# Patient Record
Sex: Female | Born: 1985 | Race: Black or African American | Hispanic: No | Smoking: Current every day smoker
Health system: Southern US, Community
[De-identification: ages and names within clinical notes are randomized; demographics above are authoritative.]

## PROBLEM LIST (undated history)

## (undated) DIAGNOSIS — G8929 Other chronic pain: Secondary | ICD-10-CM

## (undated) DIAGNOSIS — R51 Headache: Secondary | ICD-10-CM

## (undated) DIAGNOSIS — R519 Headache, unspecified: Secondary | ICD-10-CM

## (undated) HISTORY — PX: TONSILLECTOMY: SUR1361

## (undated) HISTORY — PX: LEG SURGERY: SHX1003

## (undated) HISTORY — PX: ADENOIDECTOMY: SUR15

---

## 2010-12-09 ENCOUNTER — Emergency Department (HOSPITAL_COMMUNITY)
Admission: EM | Admit: 2010-12-09 | Discharge: 2010-12-09 | Disposition: A | Discharge status: Other Acute Inpatient Hospital | Payer: Self-pay | Attending: Emergency Medicine | Admitting: Emergency Medicine

## 2010-12-09 ENCOUNTER — Inpatient Hospital Stay (HOSPITAL_COMMUNITY)
Admission: AD | Admit: 2010-12-09 | Discharge: 2010-12-09 | Disposition: A | Discharge status: Home / Self Care | Payer: Self-pay | Source: Ambulatory Visit | Attending: Obstetrics and Gynecology | Admitting: Obstetrics and Gynecology

## 2010-12-09 DIAGNOSIS — O9989 Other specified diseases and conditions complicating pregnancy, childbirth and the puerperium: Secondary | ICD-10-CM | POA: Insufficient documentation

## 2010-12-09 DIAGNOSIS — O479 False labor, unspecified: Secondary | ICD-10-CM | POA: Insufficient documentation

## 2010-12-09 DIAGNOSIS — R109 Unspecified abdominal pain: Secondary | ICD-10-CM | POA: Insufficient documentation

## 2013-01-27 ENCOUNTER — Emergency Department (HOSPITAL_COMMUNITY): Payer: Self-pay

## 2013-01-27 ENCOUNTER — Encounter (HOSPITAL_COMMUNITY): Payer: Self-pay | Admitting: *Deleted

## 2013-01-27 ENCOUNTER — Emergency Department (HOSPITAL_COMMUNITY)
Admission: EM | Admit: 2013-01-27 | Discharge: 2013-01-27 | Disposition: A | Discharge status: Home / Self Care | Payer: Self-pay | Attending: Emergency Medicine | Admitting: Emergency Medicine

## 2013-01-27 DIAGNOSIS — F17200 Nicotine dependence, unspecified, uncomplicated: Secondary | ICD-10-CM | POA: Insufficient documentation

## 2013-01-27 DIAGNOSIS — R197 Diarrhea, unspecified: Secondary | ICD-10-CM | POA: Insufficient documentation

## 2013-01-27 DIAGNOSIS — R1084 Generalized abdominal pain: Secondary | ICD-10-CM | POA: Insufficient documentation

## 2013-01-27 DIAGNOSIS — R109 Unspecified abdominal pain: Secondary | ICD-10-CM

## 2013-01-27 DIAGNOSIS — Z3202 Encounter for pregnancy test, result negative: Secondary | ICD-10-CM | POA: Insufficient documentation

## 2013-01-27 LAB — CBC WITH DIFFERENTIAL/PLATELET
Basophils Absolute: 0 10*3/uL (ref 0.0–0.1)
Eosinophils Absolute: 0.1 10*3/uL (ref 0.0–0.7)
Eosinophils Relative: 1 % (ref 0–5)
Lymphs Abs: 1.8 10*3/uL (ref 0.7–4.0)
MCH: 32.3 pg (ref 26.0–34.0)
MCHC: 34.2 g/dL (ref 30.0–36.0)
MCV: 94.2 fL (ref 78.0–100.0)
Platelets: 182 10*3/uL (ref 150–400)
RDW: 12.7 % (ref 11.5–15.5)

## 2013-01-27 LAB — URINALYSIS, ROUTINE W REFLEX MICROSCOPIC
Hgb urine dipstick: NEGATIVE
Protein, ur: NEGATIVE mg/dL
Urobilinogen, UA: 1 mg/dL (ref 0.0–1.0)

## 2013-01-27 LAB — WET PREP, GENITAL

## 2013-01-27 LAB — BASIC METABOLIC PANEL
Calcium: 9.6 mg/dL (ref 8.4–10.5)
GFR calc non Af Amer: >90 mL/min (ref >90)
Glucose, Bld: 84 mg/dL (ref 70–99)
Sodium: 137 mEq/L (ref 135–145)

## 2013-01-27 MED ORDER — HYDROMORPHONE HCL PF 1 MG/ML IJ SOLN: 1.0000 mg | Freq: Once | INTRAMUSCULAR | Status: AC

## 2013-01-27 MED ORDER — HYDROMORPHONE HCL PF 1 MG/ML IJ SOLN
1.0000 mg | Freq: Once | INTRAMUSCULAR | Status: AC
  Administered 2013-01-27: 1 mg via INTRAVENOUS
  Filled 2013-01-27: qty 1

## 2013-01-27 MED ORDER — IOHEXOL 300 MG/ML  SOLN
100.0000 mL | Freq: Once | INTRAMUSCULAR | Status: AC | PRN
  Administered 2013-01-27: 100 mL via INTRAVENOUS

## 2013-01-27 MED ORDER — CEFTRIAXONE SODIUM 250 MG IJ SOLR
250.0000 mg | Freq: Once | INTRAMUSCULAR | Status: AC
  Administered 2013-01-27: 250 mg via INTRAMUSCULAR
  Filled 2013-01-27: qty 250

## 2013-01-27 MED ORDER — SODIUM CHLORIDE 0.9 % IV SOLN
INTRAVENOUS | Status: DC
  Administered 2013-01-27: 15:00:00 via INTRAVENOUS

## 2013-01-27 MED ORDER — ONDANSETRON HCL 4 MG/2ML IJ SOLN
4.0000 mg | Freq: Once | INTRAMUSCULAR | Status: AC
  Administered 2013-01-27: 4 mg via INTRAVENOUS
  Filled 2013-01-27: qty 2

## 2013-01-27 MED ORDER — AZITHROMYCIN 1 G PO PACK
1.0000 g | PACK | Freq: Once | ORAL | Status: AC
  Administered 2013-01-27: 1 g via ORAL
  Filled 2013-01-27: qty 1

## 2013-01-27 MED ORDER — IOHEXOL 300 MG/ML  SOLN
50.0000 mL | Freq: Once | INTRAMUSCULAR | Status: AC | PRN
  Administered 2013-01-27: 50 mL via ORAL

## 2013-01-27 MED ORDER — OXYCODONE-ACETAMINOPHEN 5-325 MG PO TABS: 1.0000 | ORAL_TABLET | ORAL | Status: AC | PRN

## 2013-01-27 NOTE — ED Notes (Signed)
abd pain for 2 weeks, seen by her md yesterday and told to go to [facility] to [place]  But left before treatment done.due to long wait. NVD, Says she had had black watery stools.

## 2013-01-27 NOTE — ED Provider Notes (Signed)
History  This chart was scribed for Flint Melter, MD by Bennett Scrape, ED Scribe. This patient was seen in room APA09/APA09 and the patient's care was started at 2:21 PM.  CSN: [id number]  Arrival date & time 01/27/13  1053   First MD Initiated Contact with Patient 01/27/13 1421      Chief Complaint  Patient presents with  . Abdominal Pain     The history is provided by the patient. No language interpreter was used.    HPI Comments: Ritisha Deitrick is a 27 y.o. female who presents to the Emergency Department complaining of 2 weeks of gradual onset, gradually worsening, constant diffuse abdominal pain that radiates to the back with associated diarrhea described as runny and black that started 2 days ago. She reports 3 episodes of diarrhea yesterday and one today. She admitss that she was seen by her PCP yesterday for the same and told to go to [facility] to have her appendix evaluated. She states that she went to [place] but left due to a long wait. She is currently on Depo injections and last Depo injection was on March 5th, 2014. 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  She denies nausea, emesis and urinary symptoms. Pt does not have a h/o chronic medical conditions. She is a current everyday smoker and occasional alcohol user.   History reviewed. No pertinent past medical history.  Past Surgical History  Procedure Laterality Date  . Tonsillectomy    . Adenoidectomy      History reviewed. No pertinent family history.  History  Substance Use Topics  . Smoking status: Current Every Day Smoker  . Smokeless tobacco: Not on file  . Alcohol Use: Yes    No OB history provided.  Review of Systems  Gastrointestinal: Positive for abdominal pain and diarrhea. Negative for nausea and vomiting.  Genitourinary: Negative for dysuria and frequency.  All other systems reviewed and are negative.    Allergies  Review of patient's allergies indicates no known allergies.  Home  Medications   Current Outpatient Rx  Name  Route  Sig  Dispense  Refill  . oxyCODONE-acetaminophen (PERCOCET) 5-325 MG per tablet   Oral   Take 1 tablet by mouth every 4 (four) hours as needed for pain.   10 tablet   0     Triage Vitals: BP 107/72  Pulse 75  Temp(Src) 97.8 F (36.6 C)  Resp 20  Ht 5\' 6"  (1.676 m)  Wt 114 lb (51.71 kg)  BMI 18.41 kg/m2  SpO2 100%  Physical Exam  Nursing note and vitals reviewed. Constitutional: She is oriented to person, place, and time. She appears well-developed and well-nourished.  HENT:  Head: Normocephalic and atraumatic.  Eyes: Conjunctivae and EOM are normal. Pupils are equal, round, and reactive to light.  Neck: Normal range of motion and phonation normal. Neck supple.  Cardiovascular: Normal rate, regular rhythm and intact distal pulses.   Pulmonary/Chest: Effort normal and breath sounds normal. She exhibits no tenderness.  Abdominal: Soft. She exhibits no distension. There is tenderness (RLQ and LLQ tenderness). There is no rebound and no guarding.  Genitourinary:  NEFG. Small amt. Vaginal d/c. Diffuse pelvic tenderness without enlargement of uterus or ovaries. No pelvic mass.  Musculoskeletal: Normal range of motion.  Diffuse back tenderness  Neurological: She is alert and oriented to person, place, and time. She has normal strength. She exhibits normal muscle tone.  Skin: Skin is warm and dry.  Psychiatric: She has a normal  mood and affect. Her behavior is normal. Judgment and thought content normal.    ED Course  Procedures (including critical care time) Patient Vitals for the past 24 hrs:  BP Temp Pulse Resp SpO2 Height Weight  01/27/13 1621 111/76 mmHg - 79 18 97 % - -  01/27/13 1441 112/62 mmHg - 67 16 98 % - -  01/27/13 1106 107/72 mmHg 97.8 F (36.6 C) 75 20 100 % 5\' 6"  (1.676 m) 114 lb (51.71 kg)   Medications  0.9 %  sodium chloride infusion ( Intravenous Stopped 01/27/13 1909)  HYDROmorphone (DILAUDID) injection  1 mg (1 mg Intravenous Given 01/27/13 1516)  ondansetron (ZOFRAN) injection 4 mg (4 mg Intravenous Given 01/27/13 1515)  HYDROmorphone (DILAUDID) injection 1 mg (0 mg Intravenous Duplicate 01/27/13 1516)  iohexol (OMNIPAQUE) 300 MG/ML solution 50 mL (50 mLs Oral Contrast Given 01/27/13 1506)  iohexol (OMNIPAQUE) 300 MG/ML solution 100 mL (100 mLs Intravenous Contrast Given 01/27/13 1704)  HYDROmorphone (DILAUDID) injection 1 mg (1 mg Intravenous Given 01/27/13 1731)  cefTRIAXone (ROCEPHIN) injection 250 mg (250 mg Intramuscular Given 01/27/13 1839)  azithromycin (ZITHROMAX) powder 1 g (1 g Oral Given 01/27/13 1839)      DIAGNOSTIC STUDIES: Oxygen Saturation is 100% on room air, normal by my interpretation.    COORDINATION OF CARE: 2:44 PM-Discussed treatment plan which includes pelvic exam, CBC panel, BMP and UA with pt at bedside and pt agreed to plan.    9:24 PM Reevaluation with update and discussion. After initial assessment and treatment, an updated evaluation reveals Pain better, discussed findings. She would like to be covered for STD. Fatimata Talsma L   Labs Reviewed  WET PREP, GENITAL - Abnormal; Notable for the following:    Clue Cells Wet Prep HPF POC FEW (*)    WBC, Wet Prep HPF POC FEW (*)    All other components within normal limits  CBC WITH DIFFERENTIAL - Abnormal; Notable for the following:    Hemoglobin 15.1 (*)    All other components within normal limits  URINALYSIS, ROUTINE W REFLEX MICROSCOPIC - Abnormal; Notable for the following:    Specific Gravity, Urine >1.030 (*)    Bilirubin Urine SMALL (*)    Ketones, ur TRACE (*)    All other components within normal limits  GC/CHLAMYDIA PROBE AMP  BASIC METABOLIC PANEL  POCT PREGNANCY, URINE   Ct Abdomen Pelvis W Contrast  01/27/2013   *RADIOLOGY REPORT*  Clinical Data: Abdominal pain.  Diarrhea.  CT ABDOMEN AND PELVIS WITH CONTRAST  Technique:  Multidetector CT imaging of the abdomen and pelvis was performed following  the standard protocol during bolus administration of intravenous contrast.  Contrast: 50mL OMNIPAQUE IOHEXOL 300 MG/ML  SOLN, OMNIPAQUE IOHEXOL 300 MG/ML  SOLN  Comparison: None.  Findings: The lung bases are clear.  The liver, gallbladder, spleen, pancreas, adrenal glands, and kidneys are within normal limits.  The stomach is well-distended with oral contrast and is normal. Small bowel loops are within normal limits for caliber.  No definite small bowel wall thickening.  Normal terminal ileum. Normal appendix.  The colon is normal in caliber and wall thickness.  Normal rectum.  The uterus, ovaries, urinary bladder appear within normal limits. Abdominal aorta is normal in caliber. Negative for lymphadenopathy, ascites, or free air.  Vertebral bodies are normal in height and alignment.  No acute osseous abnormality is identified.  IMPRESSION: No acute findings, mass, or lymphadenopathy is identified in the abdomen or pelvis.   Original Report Authenticated  By: Britta Mccreedy, M.D.     1. Abdominal pain, acute       MDM  Nonspecific abdominal pain, possibly related to STD. Treated for STD/cervicitis, in ED Doubt serious PID, appendicitis, UTI or colitis. Doubt metabolic instability, serious bacterial infection or impending vascular collapse; the patient is stable for discharge.  Nursing Notes Reviewed/ Care Coordinated, and agree without changes. Applicable Imaging Reviewed.  Interpretation of Laboratory Data incorporated into ED treatment   Plan: Home Medications- Percocet; Home Treatments- rest; Recommended follow up- PCP, prn       I personally performed the services described in this documentation, which was scribed in my presence. The recorded information has been reviewed and is accurate.     Flint Melter, MD 01/27/13 2126

## 2013-01-27 NOTE — ED Notes (Signed)
Patient complaining of returning pain. Requesting medication for pain. Advised MD. Verbal order for dilaudid 1mg  IV.

## 2013-01-28 LAB — GC/CHLAMYDIA PROBE AMP: CT Probe RNA: NEGATIVE

## 2013-07-25 ENCOUNTER — Encounter (HOSPITAL_COMMUNITY): Payer: Self-pay | Admitting: Emergency Medicine

## 2013-07-25 ENCOUNTER — Emergency Department (HOSPITAL_COMMUNITY)
Admission: EM | Admit: 2013-07-25 | Discharge: 2013-07-25 | Disposition: A | Discharge status: Home / Self Care | Payer: Medicaid Other | Attending: Emergency Medicine | Admitting: Emergency Medicine

## 2013-07-25 DIAGNOSIS — F17200 Nicotine dependence, unspecified, uncomplicated: Secondary | ICD-10-CM | POA: Insufficient documentation

## 2013-07-25 DIAGNOSIS — H538 Other visual disturbances: Secondary | ICD-10-CM | POA: Insufficient documentation

## 2013-07-25 DIAGNOSIS — R112 Nausea with vomiting, unspecified: Secondary | ICD-10-CM | POA: Insufficient documentation

## 2013-07-25 DIAGNOSIS — R519 Headache, unspecified: Secondary | ICD-10-CM

## 2013-07-25 DIAGNOSIS — R51 Headache: Secondary | ICD-10-CM | POA: Insufficient documentation

## 2013-07-25 HISTORY — DX: Headache, unspecified: R51.9

## 2013-07-25 HISTORY — DX: Headache: R51

## 2013-07-25 HISTORY — DX: Other chronic pain: G89.29

## 2013-07-25 MED ORDER — DIPHENHYDRAMINE HCL 50 MG/ML IJ SOLN
25.0000 mg | Freq: Once | INTRAMUSCULAR | Status: AC
  Administered 2013-07-25: 25 mg via INTRAVENOUS
  Filled 2013-07-25: qty 1

## 2013-07-25 MED ORDER — SODIUM CHLORIDE 0.9 % IV BOLUS (SEPSIS)
1000.0000 mL | Freq: Once | INTRAVENOUS | Status: AC
  Administered 2013-07-25: 1000 mL via INTRAVENOUS

## 2013-07-25 MED ORDER — METOCLOPRAMIDE HCL 5 MG/ML IJ SOLN
10.0000 mg | Freq: Once | INTRAMUSCULAR | Status: AC
  Administered 2013-07-25: 10 mg via INTRAVENOUS
  Filled 2013-07-25: qty 2

## 2013-07-25 NOTE — ED Notes (Signed)
Patient with no complaints at this time. Respirations even and unlabored. Skin warm/dry. Discharge instructions reviewed with patient at this time. Patient given opportunity to voice concerns/ask questions. IV removed per policy and band-aid applied to site. Patient discharged at this time and left Emergency Department with steady gait.  

## 2013-07-25 NOTE — ED Notes (Addendum)
Patient with adverse reaction to reglan. Became agitated and anxious. Crying. MD aware. Orders obtained.

## 2013-07-25 NOTE — ED Notes (Signed)
Pt c/o headache, light sensitivity that started yesterday, with one episode of n/v yesterday am, states that she has had problems with headaches before but with this one the pain is worse.

## 2013-07-25 NOTE — ED Provider Notes (Signed)
CSN: [id number]     Arrival date & time 07/25/13  1326 History  This chart was scribed for Joya Gaskins, MD by Bennett Scrape, ED Scribe. This patient was seen in room APA05/APA05 and the patient's care was started at 3:05 PM.   Chief Complaint  Patient presents with  . Headache    Patient is a 27 y.o. female presenting with headaches. The history is provided by the patient. No language interpreter was used.  Headache Pain location:  Occipital Quality: shooting. Radiates to:  L neck and R neck Onset quality:  Gradual Duration:  1 day Timing:  Constant Progression:  Worsening Similar to prior headaches: yes   Relieved by:  Nothing Worsened by:  Nothing tried Ineffective treatments:  Resting in a darkened room Associated symptoms: blurred vision, nausea and vomiting   Associated symptoms: no abdominal pain, no cough, no diarrhea, no fever, no numbness and no weakness     HPI Comments: Sherry Ortega is a 27 y.o. female who presents to the Emergency Department complaining of HA that started upon waking yesterday morning that gradually worsened over 2 to 3 hours after onset. She describes the pain as shooting located in the occipital area with radiation into the posterior neck. She reports associated blurred vision and one episode of emesis yesterday. She denies any episodes today. She denies any sick contacts with similar symptoms. She reports prior episodes of the same for which she has been evaluated by her PCP for. She was advised to stop her caffeine intake but was not started on migraine medications. She states that compared to prior episodes this one is more severe. She denies any fevers, extremity weakness or numbness, cough or abdominal pain. She denies any h/o chronic medical conditions or daily medications.  Past Medical History  Diagnosis Date  . Chronic headaches    Past Surgical History  Procedure Laterality Date  . Tonsillectomy    . Adenoidectomy    . Leg  surgery     No family history on file. History  Substance Use Topics  . Smoking status: Current Every Day Smoker  . Smokeless tobacco: Not on file  . Alcohol Use: Yes   No OB history provided.  Review of Systems  Constitutional: Negative for fever.  Eyes: Positive for blurred vision.  Respiratory: Negative for cough.   Gastrointestinal: Positive for nausea and vomiting. Negative for abdominal pain and diarrhea.  Neurological: Positive for headaches. Negative for numbness.  All other systems reviewed and are negative.    Allergies  Review of patient's allergies indicates no known allergies.  Home Medications   Current Outpatient Rx  Name  Route  Sig  Dispense  Refill  . oxyCODONE-acetaminophen (PERCOCET) 5-325 MG per tablet   Oral   Take 1 tablet by mouth every 4 (four) hours as needed for pain.   10 tablet   0    Triage Vitals: BP 111/82  Pulse 107  Temp(Src) 98 F (36.7 C) (Oral)  Resp 20  Ht 5\' 6"  (1.676 m)  Wt 115 lb (52.164 kg)  BMI 18.57 kg/m2  SpO2 100%  Physical Exam  Nursing note and vitals reviewed.   CONSTITUTIONAL: Well developed/well nourished HEAD: Normocephalic/atraumatic EYES: EOMI/PERRL, no nystagmus ENMT: Mucous membranes moist NECK: supple no meningeal signs, no bruits SPINE:entire spine nontender CV: S1/S2 noted, no murmurs/rubs/gallops noted LUNGS: Lungs are clear to auscultation bilaterally, no apparent distress ABDOMEN: soft, nontender, no rebound or guarding NEURO:Awake/alert, facies symmetric, no arm or leg drift is  noted Cranial nerves 3/4/5/6/03/22/09/11/12 tested and intact Gait normal without ataxia No past pointing EXTREMITIES: pulses normal, full ROM SKIN: warm, color normal PSYCH: no abnormalities of mood noted  ED Course  Procedures (including critical care time)  Medications  metoCLOPramide (REGLAN) injection 10 mg (not administered)  diphenhydrAMINE (BENADRYL) injection 25 mg (not administered)    DIAGNOSTIC  STUDIES: Oxygen Saturation is 100% on room air, normal by my interpretation.    COORDINATION OF CARE: 3:09 PM-Advised pt that her symptoms seem to be most likely a HA. No indication of serious conditions. Discussed treatment plan which includes medications with pt at bedside and pt agreed to plan.   Labs Review Labs Reviewed - No data to display Imaging Review No results found.  EKG Interpretation   None       Low suspicion for acute neurologic process.  She did have reaction to reglan, but reports she would like to go home.  Stable for d/c home   MDM  No diagnosis found. Nursing notes including past medical history and social history reviewed and considered in documentation   I personally performed the services described in this documentation, which was scribed in my presence. The recorded information has been reviewed and is accurate.      Joya Gaskins, MD 07/25/13 [phone]

## 2013-11-07 IMAGING — CT CT ABD-PELV W/ CM
2 of 4 series · 16 of 46 positions shown, 18 images · IV contrast (Omnipaque 300)
Comparison: None.

CLINICAL DATA: Abdominal pain.  Diarrhea.

CT ABDOMEN AND PELVIS WITH CONTRAST
TECHNIQUE: Multidetector CT imaging of the abdomen and pelvis was
performed following the standard protocol during bolus
administration of intravenous contrast.
Contrast: 50mL OMNIPAQUE IOHEXOL 300 MG/ML  SOLN, 100mL OMNIPAQUE
IOHEXOL 300 MG/ML  SOLN

[Series 2: abd_pel_with 5.0 b40f · axial · 0.65mm/px · z∈[-470,-84]mm · 13 of 85 slices shown, 15 images]
[im 4/85  soft-tissue]
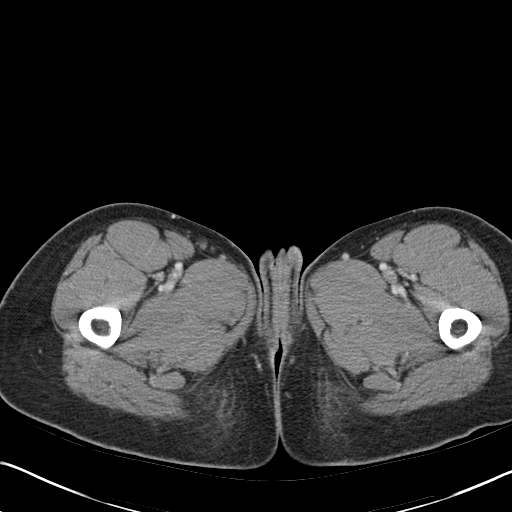
[im 4/85  bone]
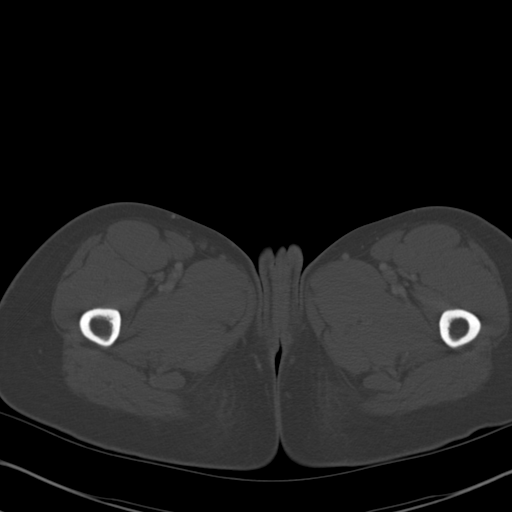
[im 11/85  soft-tissue]
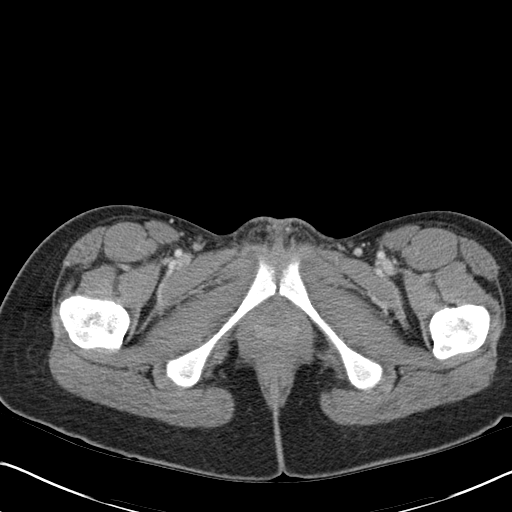
[im 17/85  soft-tissue]
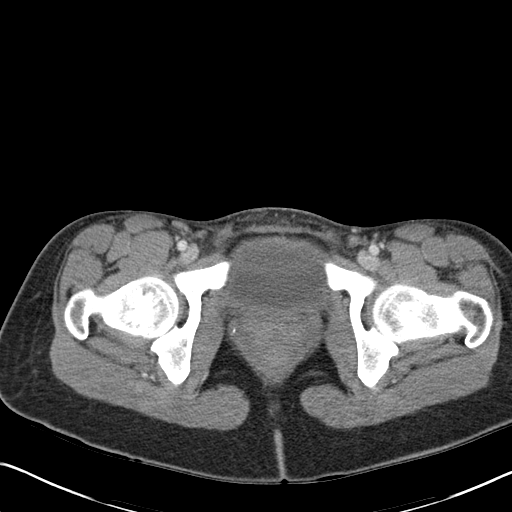
[im 24/85  soft-tissue]
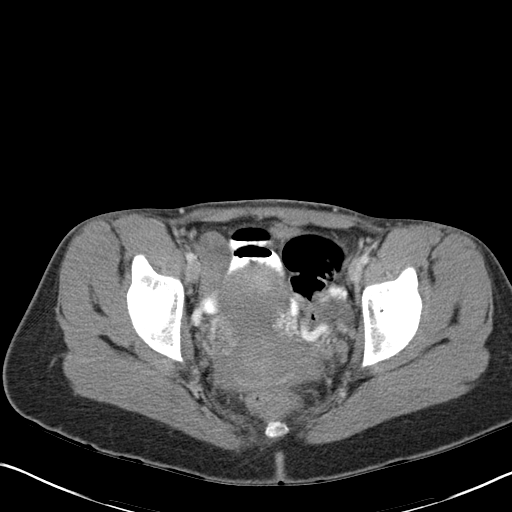
[im 31/85  soft-tissue]
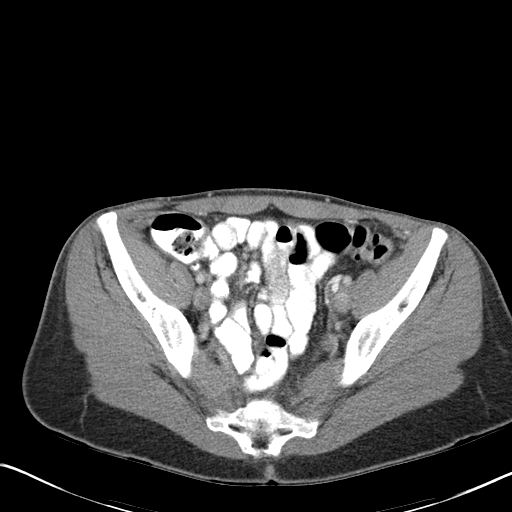
[im 37/85  soft-tissue]
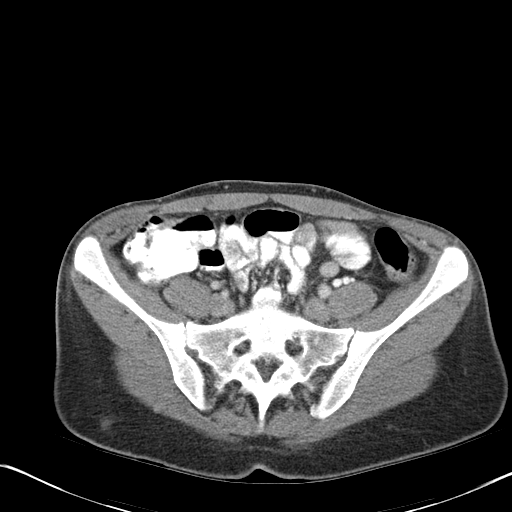
[im 44/85  soft-tissue]
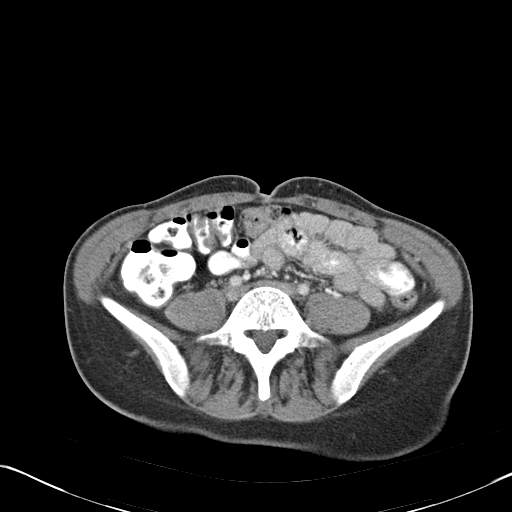
[im 48/85  soft-tissue]
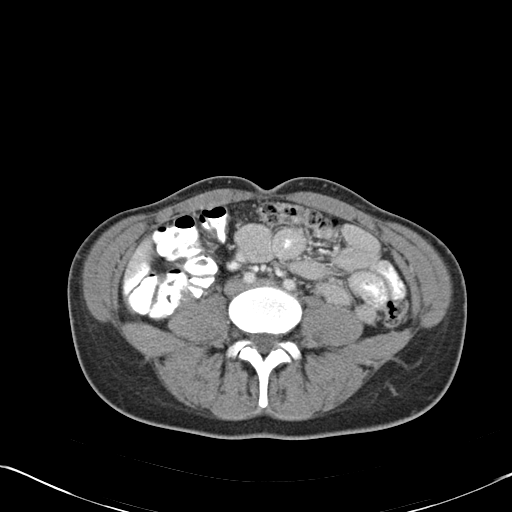
[im 54/85  soft-tissue]
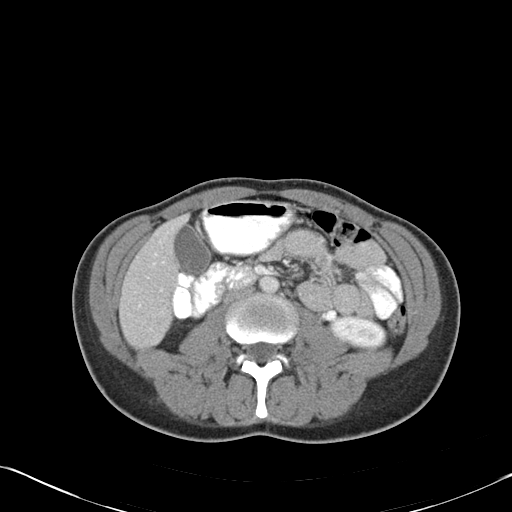
[im 54/85  bone]
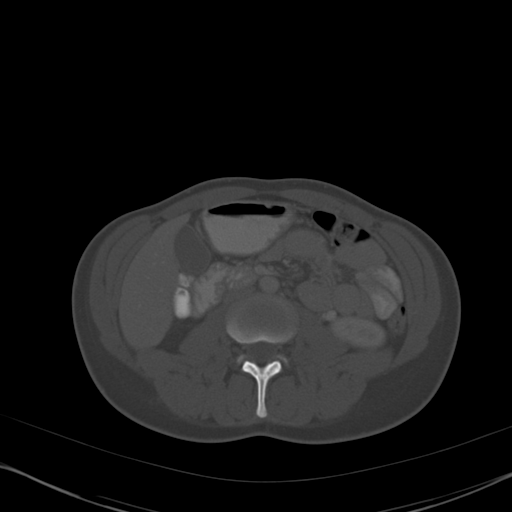
[im 61/85  soft-tissue]
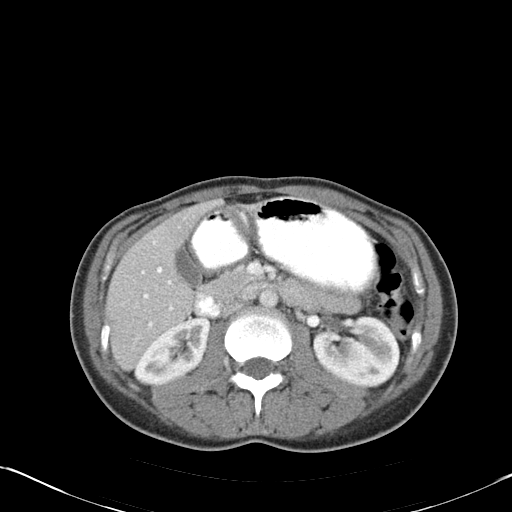
[im 68/85  soft-tissue]
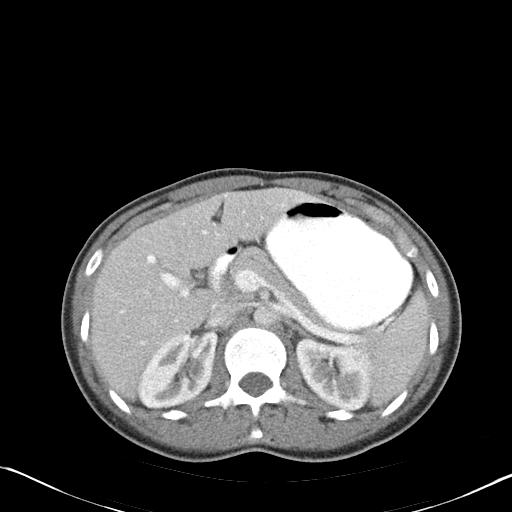
[im 74/85  soft-tissue]
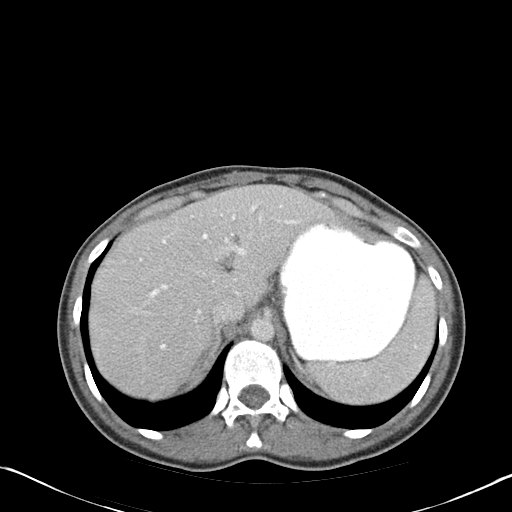
[im 81/85  soft-tissue]
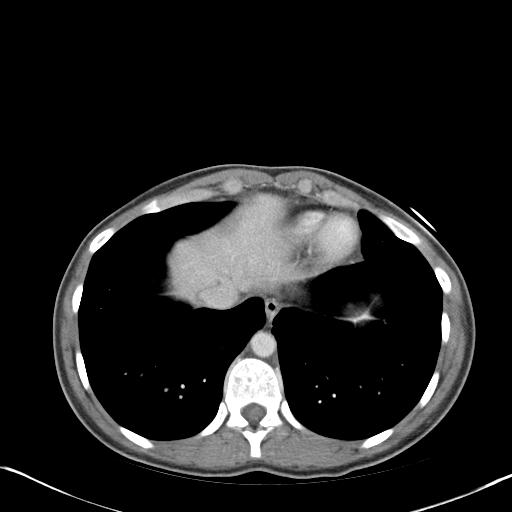

[Series 4: abd_pel_with 3.0 spo · coronal · 0.59mm/px · 3 of 61 slices shown]
[im 21/61  soft-tissue]
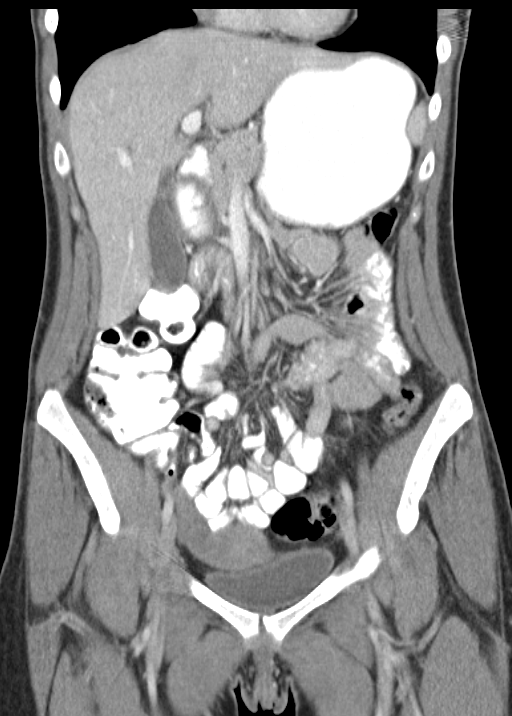
[im 27/61  soft-tissue]
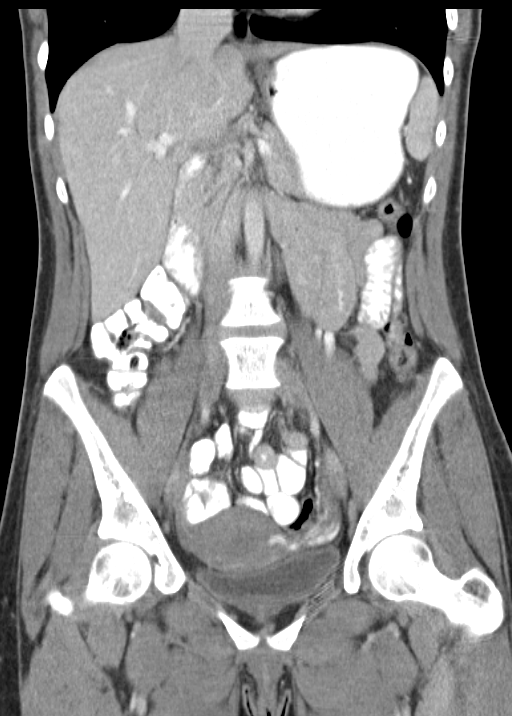
[im 34/61  soft-tissue]
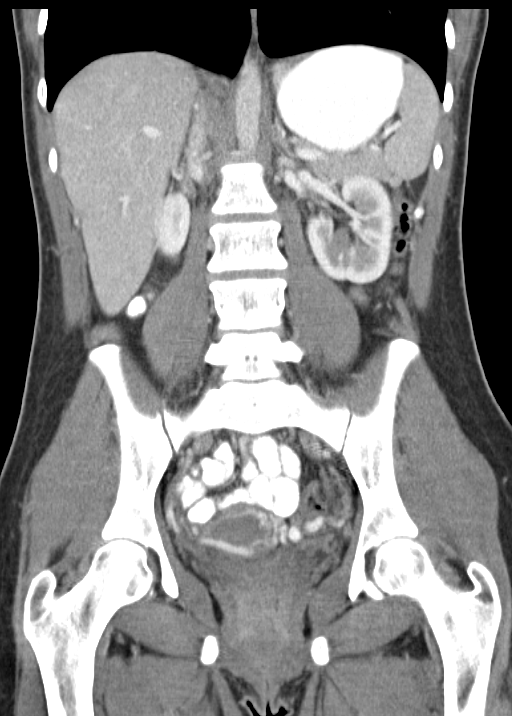

[16 of 46 positions shown; findings below may reference images not displayed]

FINDINGS: The lung bases are clear.

The liver, gallbladder, spleen, pancreas, adrenal glands, and
kidneys are within normal limits.

The stomach is well-distended with oral contrast and is normal.
Small bowel loops are within normal limits for caliber.  No
definite small bowel wall thickening.  Normal terminal ileum.
Normal appendix.  The colon is normal in caliber and wall
thickness.  Normal rectum.

The uterus, ovaries, urinary bladder appear within normal limits.
Abdominal aorta is normal in caliber. Negative for lymphadenopathy,
ascites, or free air.

Vertebral bodies are normal in height and alignment.  No acute
osseous abnormality is identified.
IMPRESSION: No acute findings, mass, or lymphadenopathy is identified in the
abdomen or pelvis.

## 2013-12-25 ENCOUNTER — Emergency Department: Payer: Self-pay | Admitting: Emergency Medicine

## 2014-10-05 IMAGING — CR DG KNEE 1-2V*R*
1 series · 2 of 2 positions shown · non-contrast
Comparison: None.

CLINICAL DATA: Pain after blunt trauma.

EXAM:
RIGHT KNEE - 1-2 VIEW

[Series 1: x knee ap right · 0.14mm/px · 2 of 2 slices shown]
[im 1/2]
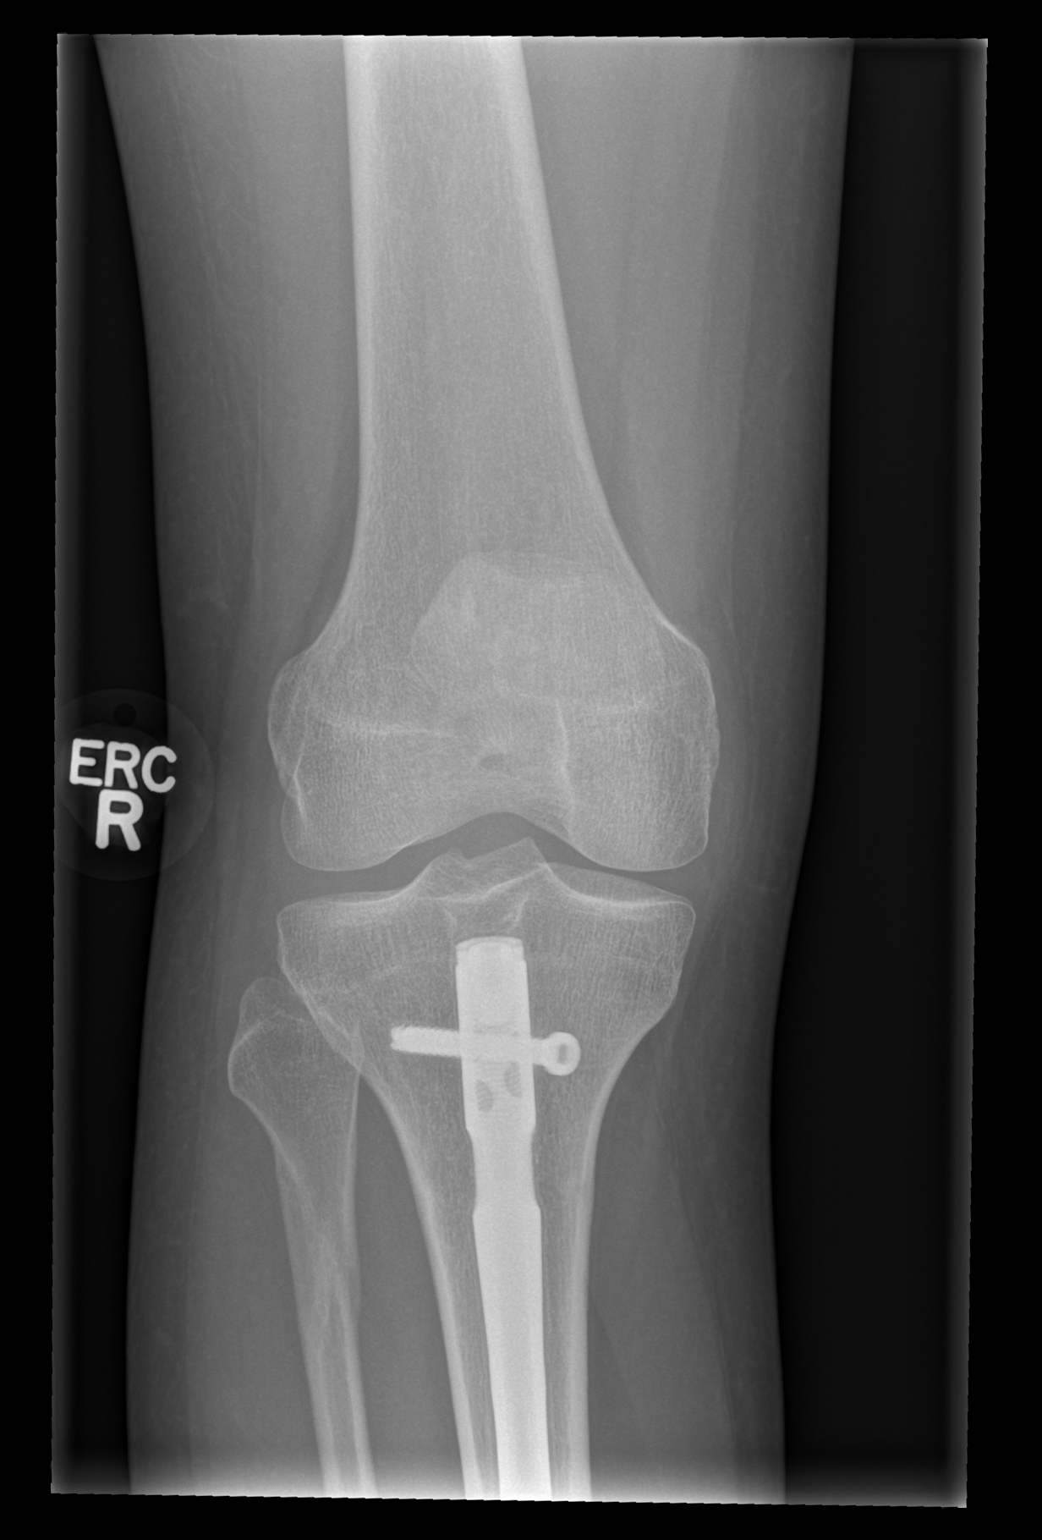
[im 2/2]
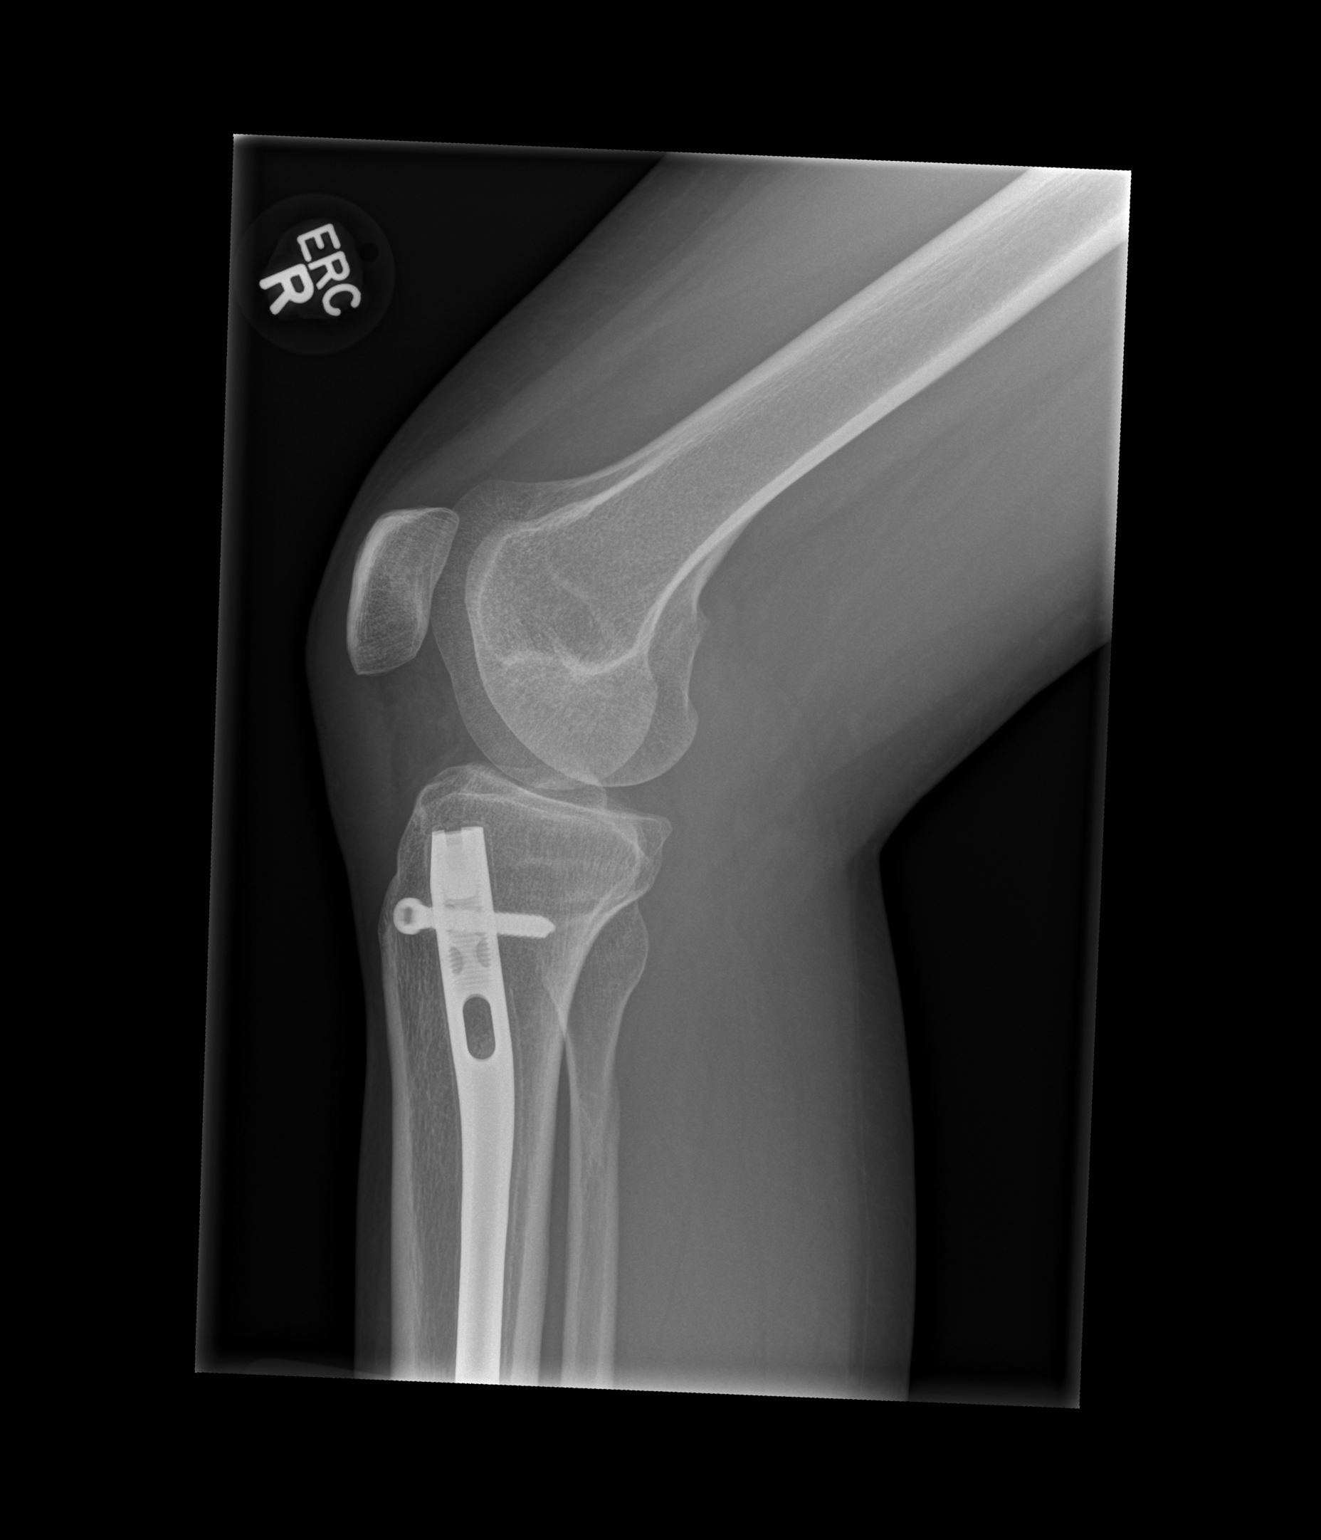

[2 of 2 positions shown; findings below may reference images not displayed]

FINDINGS: There is no acute fracture or dislocation. No joint effusion. Intra
medullary rod in the proximal femur with fixation screw. Healed
fracture of the proximal fibula.
IMPRESSION: No acute abnormalities.

## 2015-06-05 DIAGNOSIS — R519 Headache, unspecified: Secondary | ICD-10-CM

## 2015-06-05 HISTORY — DX: Headache, unspecified: R51.9

## 2015-06-06 ENCOUNTER — Other Ambulatory Visit: Payer: Self-pay | Admitting: Neurology

## 2015-06-06 DIAGNOSIS — R519 Headache, unspecified: Secondary | ICD-10-CM

## 2015-06-06 DIAGNOSIS — R51 Headache: Principal | ICD-10-CM

## 2015-06-20 ENCOUNTER — Ambulatory Visit
Admission: RE | Admit: 2015-06-20 | Discharge: 2015-06-20 | Disposition: A | Discharge status: Home / Self Care | Payer: Medicaid Other | Source: Ambulatory Visit | Attending: Neurology | Admitting: Neurology

## 2015-06-20 ENCOUNTER — Other Ambulatory Visit: Payer: Self-pay | Admitting: Neurology

## 2015-06-20 DIAGNOSIS — R51 Headache: Secondary | ICD-10-CM | POA: Insufficient documentation

## 2015-06-20 DIAGNOSIS — R519 Headache, unspecified: Secondary | ICD-10-CM

## 2015-06-20 MED ORDER — GADOBENATE DIMEGLUMINE 529 MG/ML IV SOLN: 15.0000 mL | Freq: Once | INTRAVENOUS | Status: DC | PRN

## 2015-06-21 ENCOUNTER — Other Ambulatory Visit: Payer: Self-pay | Admitting: Neurology

## 2015-06-21 DIAGNOSIS — R519 Headache, unspecified: Secondary | ICD-10-CM

## 2015-06-21 DIAGNOSIS — R51 Headache: Principal | ICD-10-CM

## 2019-02-06 DIAGNOSIS — F17210 Nicotine dependence, cigarettes, uncomplicated: Secondary | ICD-10-CM | POA: Insufficient documentation

## 2019-02-06 DIAGNOSIS — N3000 Acute cystitis without hematuria: Secondary | ICD-10-CM | POA: Insufficient documentation

## 2019-02-06 DIAGNOSIS — R1010 Upper abdominal pain, unspecified: Secondary | ICD-10-CM | POA: Insufficient documentation

## 2019-02-07 ENCOUNTER — Other Ambulatory Visit: Payer: Self-pay

## 2019-02-07 ENCOUNTER — Emergency Department (HOSPITAL_COMMUNITY)
Admission: EM | Admit: 2019-02-07 | Discharge: 2019-02-07 | Disposition: A | Discharge status: Home / Self Care | Payer: Self-pay | Attending: Emergency Medicine | Admitting: Emergency Medicine

## 2019-02-07 ENCOUNTER — Encounter (HOSPITAL_COMMUNITY): Payer: Self-pay | Admitting: *Deleted

## 2019-02-07 DIAGNOSIS — R1010 Upper abdominal pain, unspecified: Secondary | ICD-10-CM

## 2019-02-07 DIAGNOSIS — N3000 Acute cystitis without hematuria: Secondary | ICD-10-CM

## 2019-02-07 LAB — POC URINE PREG, ED: Preg Test, Ur: NEGATIVE

## 2019-02-07 LAB — URINALYSIS, ROUTINE W REFLEX MICROSCOPIC
Bilirubin Urine: NEGATIVE
Glucose, UA: NEGATIVE mg/dL
Hgb urine dipstick: NEGATIVE
Ketones, ur: 20 mg/dL — AB
Nitrite: NEGATIVE
Protein, ur: NEGATIVE mg/dL
Specific Gravity, Urine: 1.021 (ref 1.005–1.030)
pH: 5 (ref 5.0–8.0)

## 2019-02-07 LAB — COMPREHENSIVE METABOLIC PANEL WITH GFR
ALT: 15 U/L (ref 0–44)
AST: 20 U/L (ref 15–41)
Albumin: 4.2 g/dL (ref 3.5–5.0)
Alkaline Phosphatase: 40 U/L (ref 38–126)
Anion gap: 12 (ref 5–15)
BUN: 8 mg/dL (ref 6–20)
CO2: 18 mmol/L — ABNORMAL LOW (ref 22–32)
Calcium: 9.3 mg/dL (ref 8.9–10.3)
Chloride: 108 mmol/L (ref 98–111)
Creatinine, Ser: 0.66 mg/dL (ref 0.44–1.00)
GFR calc Af Amer: >60 mL/min
GFR calc non Af Amer: >60 mL/min
Glucose, Bld: 112 mg/dL — ABNORMAL HIGH (ref 70–99)
Potassium: 3.9 mmol/L (ref 3.5–5.1)
Sodium: 138 mmol/L (ref 135–145)
Total Bilirubin: 1 mg/dL (ref 0.3–1.2)
Total Protein: 7.7 g/dL (ref 6.5–8.1)

## 2019-02-07 LAB — CBC
HCT: 44.4 % (ref 36.0–46.0)
Hemoglobin: 14.7 g/dL (ref 12.0–15.0)
MCH: 30.8 pg (ref 26.0–34.0)
MCHC: 33.1 g/dL (ref 30.0–36.0)
MCV: 93.1 fL (ref 80.0–100.0)
Platelets: 194 10*3/uL (ref 150–400)
RBC: 4.77 MIL/uL (ref 3.87–5.11)
RDW: 12 % (ref 11.5–15.5)
WBC: 8.5 10*3/uL (ref 4.0–10.5)
nRBC: 0 % (ref 0.0–0.2)

## 2019-02-07 LAB — LIPASE, BLOOD: Lipase: 24 U/L (ref 11–51)

## 2019-02-07 MED ORDER — SODIUM CHLORIDE 0.9 % IV BOLUS
1000.0000 mL | Freq: Once | INTRAVENOUS | Status: AC
  Administered 2019-02-07: 1000 mL via INTRAVENOUS

## 2019-02-07 MED ORDER — ONDANSETRON 4 MG PO TBDP: ORAL_TABLET | ORAL | 0 refills | Status: AC

## 2019-02-07 MED ORDER — CEPHALEXIN 500 MG PO CAPS
500.0000 mg | ORAL_CAPSULE | Freq: Once | ORAL | Status: AC
  Administered 2019-02-07: 500 mg via ORAL
  Filled 2019-02-07: qty 1

## 2019-02-07 MED ORDER — ONDANSETRON HCL 4 MG/2ML IJ SOLN
4.0000 mg | Freq: Once | INTRAMUSCULAR | Status: AC
  Administered 2019-02-07: 4 mg via INTRAVENOUS

## 2019-02-07 MED ORDER — CEPHALEXIN 500 MG PO CAPS: 500.0000 mg | ORAL_CAPSULE | Freq: Four times a day (QID) | ORAL | 0 refills | Status: AC

## 2019-02-07 MED ORDER — LORAZEPAM 2 MG/ML IJ SOLN
1.0000 mg | Freq: Once | INTRAMUSCULAR | Status: AC
  Administered 2019-02-07: 1 mg via INTRAVENOUS
  Filled 2019-02-07: qty 1

## 2019-02-07 MED ORDER — PANTOPRAZOLE SODIUM 40 MG IV SOLR
40.0000 mg | Freq: Once | INTRAVENOUS | Status: AC
  Administered 2019-02-07: 40 mg via INTRAVENOUS
  Filled 2019-02-07: qty 40

## 2019-02-07 MED ORDER — KETOROLAC TROMETHAMINE 30 MG/ML IJ SOLN
30.0000 mg | Freq: Once | INTRAMUSCULAR | Status: AC
  Administered 2019-02-07: 30 mg via INTRAVENOUS
  Filled 2019-02-07: qty 1

## 2019-02-07 MED ORDER — PANTOPRAZOLE SODIUM 20 MG PO TBEC: 20.0000 mg | DELAYED_RELEASE_TABLET | Freq: Every day | ORAL | 0 refills | Status: AC

## 2019-02-07 MED ORDER — SODIUM CHLORIDE 0.9% FLUSH: 3.0000 mL | Freq: Once | INTRAVENOUS | Status: DC

## 2019-02-07 MED ORDER — ONDANSETRON HCL 4 MG/2ML IJ SOLN
INTRAMUSCULAR | Status: AC
  Administered 2019-02-07: 4 mg via INTRAVENOUS
  Filled 2019-02-07: qty 2

## 2019-02-07 MED ORDER — ONDANSETRON HCL 4 MG/2ML IJ SOLN
4.0000 mg | Freq: Once | INTRAMUSCULAR | Status: AC
  Administered 2019-02-07: 4 mg via INTRAVENOUS
  Filled 2019-02-07: qty 2

## 2019-02-07 NOTE — ED Provider Notes (Signed)
[facility] EMERGENCY DEPARTMENT Provider Note   CSN: [id number] Arrival date & time: 02/06/19  2358    History   Chief Complaint Chief Complaint  Patient presents with  . Abdominal Pain    HPI Sherry Ortega is a 33 y.o. female.     Pt complains of upper abdominal pain with nausea and vomiting  The history is provided by the patient.  Abdominal Pain  Pain location:  Epigastric Pain quality: aching   Pain radiates to:  Does not radiate Pain severity:  Moderate Onset quality:  Sudden Timing:  Constant Progression:  Worsening Chronicity:  New Context: not alcohol use   Associated symptoms: no chest pain, no cough, no diarrhea, no fatigue and no hematuria     Past Medical History:  Diagnosis Date  . Chronic headaches     There are no active problems to display for this patient.   Past Surgical History:  Procedure Laterality Date  . ADENOIDECTOMY    . LEG SURGERY    . TONSILLECTOMY       OB History   No obstetric history on file.      Home Medications    Prior to Admission medications   Medication Sig Start Date End Date Taking? Authorizing Provider  cephALEXin (KEFLEX) 500 MG capsule Take 1 capsule (500 mg total) by mouth 4 (four) times daily. 02/07/19   Bethann Berkshire, MD  ondansetron (ZOFRAN ODT) 4 MG disintegrating tablet 4mg  ODT q4 hours prn nausea/vomit 02/07/19   Bethann Berkshire, MD  oxyCODONE-acetaminophen (PERCOCET) 5-325 MG per tablet Take 1 tablet by mouth every 4 (four) hours as needed for pain. 01/27/13   Mancel Bale, MD  pantoprazole (PROTONIX) 20 MG tablet Take 1 tablet (20 mg total) by mouth daily. 02/07/19   Bethann Berkshire, MD    Family History History reviewed. No pertinent family history.  Social History Social History   Tobacco Use  . Smoking status: Current Every Day Smoker    Packs/day: 0.50    Types: Cigarettes  . Smokeless tobacco: Never Used  Substance Use Topics  . Alcohol use: Yes  . Drug use: No     Allergies    Reglan [metoclopramide]   Review of Systems Review of Systems  Constitutional: Negative for appetite change and fatigue.  HENT: Negative for congestion, ear discharge and sinus pressure.   Eyes: Negative for discharge.  Respiratory: Negative for cough.   Cardiovascular: Negative for chest pain.  Gastrointestinal: Positive for abdominal pain. Negative for diarrhea.  Genitourinary: Negative for frequency and hematuria.  Musculoskeletal: Negative for back pain.  Skin: Negative for rash.  Neurological: Negative for seizures and headaches.  Psychiatric/Behavioral: Negative for hallucinations.     Physical Exam Updated Vital Signs BP 125/82 (BP Location: Right Arm)   Pulse 83   Temp 97.7 F (36.5 C) (Oral)   Resp 18   Ht 5\' 6"  (1.676 m)   Wt 52 kg   LMP 01/24/2019   SpO2 100%   BMI 18.50 kg/m   Physical Exam Vitals signs and nursing note reviewed.  Constitutional:      Appearance: She is well-developed.  HENT:     Head: Normocephalic.     Nose: Nose normal.  Eyes:     General: No scleral icterus.    Conjunctiva/sclera: Conjunctivae normal.  Neck:     Musculoskeletal: Neck supple.     Thyroid: No thyromegaly.  Cardiovascular:     Rate and Rhythm: Normal rate and regular rhythm.  Heart sounds: No murmur. No friction rub. No gallop.   Pulmonary:     Breath sounds: No stridor. No wheezing or rales.  Chest:     Chest wall: No tenderness.  Abdominal:     General: There is no distension.     Tenderness: There is abdominal tenderness. There is no rebound.  Musculoskeletal: Normal range of motion.  Lymphadenopathy:     Cervical: No cervical adenopathy.  Skin:    Findings: No erythema or rash.  Neurological:     Mental Status: She is alert and oriented to person, place, and time.     Motor: No abnormal muscle tone.     Coordination: Coordination normal.  Psychiatric:        Behavior: Behavior normal.      ED Treatments / Results  Labs (all labs ordered  are listed, but only abnormal results are displayed) Labs Reviewed  COMPREHENSIVE METABOLIC PANEL - Abnormal; Notable for the following components:      Result Value   CO2 18 (*)    Glucose, Bld 112 (*)    All other components within normal limits  URINALYSIS, ROUTINE W REFLEX MICROSCOPIC - Abnormal; Notable for the following components:   APPearance HAZY (*)    Ketones, ur 20 (*)    Leukocytes,Ua MODERATE (*)    Bacteria, UA RARE (*)    All other components within normal limits  URINE CULTURE  LIPASE, BLOOD  CBC  POC URINE PREG, ED    EKG None  Radiology No results found.  Procedures Procedures (including critical care time)  Medications Ordered in ED Medications  sodium chloride flush (NS) 0.9 % injection 3 mL (0 mLs Intravenous Hold 02/07/19 0258)  cephALEXin (KEFLEX) capsule 500 mg (has no administration in time range)  ondansetron (ZOFRAN) injection 4 mg (4 mg Intravenous Given 02/07/19 0110)  sodium chloride 0.9 % bolus 1,000 mL (0 mLs Intravenous Stopped 02/07/19 0510)  ketorolac (TORADOL) 30 MG/ML injection 30 mg (30 mg Intravenous Given 02/07/19 0314)  pantoprazole (PROTONIX) injection 40 mg (40 mg Intravenous Given 02/07/19 0314)  LORazepam (ATIVAN) injection 1 mg (1 mg Intravenous Given 02/07/19 0511)  ondansetron (ZOFRAN) injection 4 mg (4 mg Intravenous Given 02/07/19 0511)     Initial Impression / Assessment and Plan / ED Course  I have reviewed the triage vital signs and the nursing notes.  Pertinent labs & imaging results that were available during my care of the patient were reviewed by me and considered in my medical decision making (see chart for details).     Labs reviewed.  Urinalysis suggests a urinary tract infection.  Rest of labs unremarkable.  Patient improved with pain medicines and nausea.  Suspect urinary tract infection and gastritis.  She is placed on Keflex Zofran and protonic.  She will follow-up with her doctor Final Clinical Impressions(s)  / ED Diagnoses   Final diagnoses:  Pain of upper abdomen  Acute cystitis without hematuria    ED Discharge Orders         Ordered    cephALEXin (KEFLEX) 500 MG capsule  4 times daily     02/07/19 0551    ondansetron (ZOFRAN ODT) 4 MG disintegrating tablet     02/07/19 0551    pantoprazole (PROTONIX) 20 MG tablet  Daily     02/07/19 0551           Bethann BerkshireZammit, Azarias Chiou, MD 02/07/19 [phone]

## 2019-02-07 NOTE — Discharge Instructions (Addendum)
Follow-up next week with your family doctor and drink plenty of fluids

## 2019-02-07 NOTE — ED Triage Notes (Signed)
Pt c/o abdominal pain that started last night; pt states she has been unable to keep food down

## 2019-02-08 LAB — URINE CULTURE

## 2020-12-27 DIAGNOSIS — F439 Reaction to severe stress, unspecified: Secondary | ICD-10-CM

## 2020-12-27 HISTORY — DX: Reaction to severe stress, unspecified: F43.9

## 2022-04-08 DIAGNOSIS — I82409 Acute embolism and thrombosis of unspecified deep veins of unspecified lower extremity: Secondary | ICD-10-CM | POA: Insufficient documentation

## 2022-04-08 DIAGNOSIS — I2699 Other pulmonary embolism without acute cor pulmonale: Secondary | ICD-10-CM

## 2022-04-08 HISTORY — DX: Acute embolism and thrombosis of unspecified deep veins of unspecified lower extremity: I82.409

## 2022-04-08 HISTORY — DX: Other pulmonary embolism without acute cor pulmonale: I26.99

## 2022-04-09 DIAGNOSIS — F17200 Nicotine dependence, unspecified, uncomplicated: Secondary | ICD-10-CM

## 2022-04-09 HISTORY — DX: Nicotine dependence, unspecified, uncomplicated: F17.200

## 2022-06-09 DIAGNOSIS — Z7901 Long term (current) use of anticoagulants: Secondary | ICD-10-CM

## 2022-06-09 HISTORY — DX: Long term (current) use of anticoagulants: Z79.01

## 2022-10-28 DIAGNOSIS — T782XXA Anaphylactic shock, unspecified, initial encounter: Secondary | ICD-10-CM

## 2022-10-28 HISTORY — DX: Anaphylactic shock, unspecified, initial encounter: T78.2XXA

## 2022-11-10 DIAGNOSIS — M79601 Pain in right arm: Secondary | ICD-10-CM | POA: Insufficient documentation

## 2023-01-12 DIAGNOSIS — F3341 Major depressive disorder, recurrent, in partial remission: Secondary | ICD-10-CM

## 2023-01-12 DIAGNOSIS — Z975 Presence of (intrauterine) contraceptive device: Secondary | ICD-10-CM

## 2023-01-12 HISTORY — DX: Presence of (intrauterine) contraceptive device: Z97.5

## 2023-01-12 HISTORY — DX: Major depressive disorder, recurrent, in partial remission: F33.41

## 2023-08-19 DIAGNOSIS — Z86718 Personal history of other venous thrombosis and embolism: Secondary | ICD-10-CM | POA: Insufficient documentation

## 2023-08-19 HISTORY — DX: Personal history of other venous thrombosis and embolism: Z86.718

## 2023-12-06 ENCOUNTER — Encounter (HOSPITAL_COMMUNITY): Payer: Self-pay

## 2023-12-06 ENCOUNTER — Emergency Department (HOSPITAL_COMMUNITY)
Admission: EM | Admit: 2023-12-06 | Discharge: 2023-12-06 | Disposition: A | Discharge status: Home / Self Care | Payer: PRIVATE HEALTH INSURANCE | Attending: Emergency Medicine | Admitting: Emergency Medicine

## 2023-12-06 ENCOUNTER — Other Ambulatory Visit: Payer: Self-pay

## 2023-12-06 DIAGNOSIS — F17210 Nicotine dependence, cigarettes, uncomplicated: Secondary | ICD-10-CM | POA: Insufficient documentation

## 2023-12-06 DIAGNOSIS — F410 Panic disorder [episodic paroxysmal anxiety] without agoraphobia: Secondary | ICD-10-CM | POA: Insufficient documentation

## 2023-12-06 DIAGNOSIS — Z7901 Long term (current) use of anticoagulants: Secondary | ICD-10-CM | POA: Diagnosis not present

## 2023-12-06 MED ORDER — ONDANSETRON 8 MG PO TBDP
8.0000 mg | ORAL_TABLET | Freq: Once | ORAL | Status: AC
  Administered 2023-12-06: 8 mg via ORAL
  Filled 2023-12-06: qty 1

## 2023-12-06 MED ORDER — LORAZEPAM 1 MG PO TABS
2.0000 mg | ORAL_TABLET | Freq: Once | ORAL | Status: AC
  Administered 2023-12-06: 2 mg via ORAL
  Filled 2023-12-06: qty 2

## 2023-12-06 MED ORDER — LORAZEPAM 2 MG/ML IJ SOLN
2.0000 mg | Freq: Once | INTRAMUSCULAR | Status: AC
  Administered 2023-12-06: 2 mg via INTRAMUSCULAR
  Filled 2023-12-06: qty 1

## 2023-12-06 NOTE — ED Notes (Signed)
 Pt gagged and spit up meds in trash can- Dr Judd Lien aware new orders received for IM injection

## 2023-12-06 NOTE — Discharge Instructions (Signed)
 Follow-up with your primary care provider regarding refilling your prescriptions.  Return to the ER if you experience any new and/or concerning issues.

## 2023-12-06 NOTE — ED Provider Notes (Signed)
 [place] EMERGENCY DEPARTMENT AT [facility] Provider Note   CSN: [id number] Arrival date & time: 12/06/23  0037     History  Chief Complaint  Patient presents with   Panic Attack    Sherry Ortega is a 38 y.o. female.  Patient is a 38 year old female presenting with a panic attack.  She was attending a concert this evening.  During the ride home, she reports smoking a cigarette, then becoming extremely anxious and hyperventilating.  She has apparently been out of her anxiety medication for the past week.  She denies any other symptoms.  She felt perfectly fine prior to this episode.  She does report having 1 drink and smoking marijuana earlier in the evening.  The history is provided by the patient.       Home Medications Prior to Admission medications   Medication Sig Start Date End Date Taking? Authorizing Provider  ALPRAZolam Prudy Feeler) 0.25 MG tablet Take by mouth. 12/03/23 12/02/24 Yes [provider]  amoxicillin-clavulanate (AUGMENTIN) 875-125 MG tablet 1 tablet Orally every 12 hrs for 10 day(s) 04/08/22  Yes [provider]  busPIRone (BUSPAR) 5 MG tablet 1 tablet Orally three times a day 01/12/23  Yes [provider]  Cyanocobalamin (VITAMIN B12) 1000 MCG TBCR 1 tablet Orally Once a day 11/06/22  Yes [provider]  FLUoxetine (PROZAC) 40 MG capsule Take 1 capsule by mouth daily. 08/20/23 08/19/24 Yes [provider]  guaiFENesin-codeine 100-10 MG/5ML syrup 10 mL as needed Orally every 4 hrs for 5 days 04/08/22  Yes [provider]  nicotine (NICODERM CQ - DOSED IN MG/24 HOURS) 14 mg/24hr patch Place onto the skin. 03/10/23  Yes [provider]  nortriptyline (PAMELOR) 10 MG capsule Take 1 capsule at night for 1 week, then increase to 2 capsules at night and continue this dose 06/05/15  Yes [provider]  predniSONE (STERAPRED UNI-PAK 21 TAB) 10 MG (21) TBPK tablet as directed Orally Once a day  for 6 days 04/08/22  Yes [provider]  warfarin (COUMADIN) 5 MG tablet Current dose: 5 mg daily 07/23/23 07/21/24 Yes [provider]  acetaminophen (TYLENOL) 500 MG tablet Take by mouth.    [provider]  apixaban (ELIQUIS) 2.5 MG TABS tablet Eliquis    [provider]  cephALEXin (KEFLEX) 500 MG capsule Take 1 capsule (500 mg total) by mouth 4 (four) times daily. 02/07/19   Bethann Berkshire, MD  Cyanocobalamin (VITAMIN B 12) 100 MCG LOZG Vitamin B 12    [provider]  FLUOXETINE & DIET MANAGE PROD PO Fluoxetine    [provider]  omeprazole (PRILOSEC) 40 MG capsule Take by mouth.    [provider]  ondansetron (ZOFRAN ODT) 4 MG disintegrating tablet 4mg  ODT q4 hours prn nausea/vomit 02/07/19   Bethann Berkshire, MD  oxyCODONE-acetaminophen (PERCOCET) 5-325 MG per tablet Take 1 tablet by mouth every 4 (four) hours as needed for pain. 01/27/13   Mancel Bale, MD  pantoprazole (PROTONIX) 20 MG tablet Take 1 tablet (20 mg total) by mouth daily. 02/07/19   Bethann Berkshire, MD  warfarin (COUMADIN) 4 MG tablet 1 tablet Orally on tuesday, thursday, saturday and sunday    [provider]      Allergies    Enoxaparin, Iodinated contrast media, Metoclopramide, and Fondaparinux    Review of Systems   Review of Systems  All other systems reviewed and are negative.   Physical Exam Updated Vital Signs BP (!) 121/90  Pulse 100   Temp 98.4 F (36.9 C) (Oral)   Resp (!) 24   Ht 5\' 6"  (1.676 m)   Wt 56.7 kg   SpO2 100%   BMI 20.18 kg/m  Physical Exam Vitals and nursing note reviewed.  Constitutional:      General: She is not in acute distress.    Appearance: She is well-developed. She is not diaphoretic.     Comments: Patient appears very anxious and is hyperventilating.  HENT:     Head: Normocephalic and atraumatic.  Cardiovascular:     Rate and Rhythm: Normal rate and regular rhythm.     Heart sounds: No murmur  heard.    No friction rub. No gallop.  Pulmonary:     Effort: Pulmonary effort is normal. No respiratory distress.     Breath sounds: Normal breath sounds. No wheezing.  Abdominal:     General: Bowel sounds are normal. There is no distension.     Palpations: Abdomen is soft.     Tenderness: There is no abdominal tenderness.  Musculoskeletal:        General: Normal range of motion.     Cervical back: Normal range of motion and neck supple.  Skin:    General: Skin is warm and dry.  Neurological:     General: No focal deficit present.     Mental Status: She is alert and oriented to person, place, and time.     ED Results / Procedures / Treatments   Labs (all labs ordered are listed, but only abnormal results are displayed) Labs Reviewed - No data to display  EKG None  Radiology No results found.  Procedures Procedures    Medications Ordered in ED Medications  LORazepam (ATIVAN) tablet 2 mg (has no administration in time range)    ED Course/ Medical Decision Making/ A&P  Patient is a 38 year old female presenting with a panic attack.  She arrives here extremely anxious, hyperventilating, and tearful.  She was given IM Ativan and is now relaxing comfortably.  She did request medication for nausea so was given Zofran ODT.  At this point she feels better and is now resting comfortably.  Patient to be discharged with as needed return.  Final Clinical Impression(s) / ED Diagnoses Final diagnoses:  None    Rx / DC Orders ED Discharge Orders     None         Geoffery Lyons, MD 12/06/23 [phone]

## 2023-12-06 NOTE — ED Triage Notes (Addendum)
 Pt bib EMS after she had a panic attack on her way home from a concert. Pt with hx of panic attacks. Per EMS, pt is out of her anxiety medication. EMS also reports pt admitted to smoking a "blunt" with a friend tonight.

## 2024-02-23 ENCOUNTER — Encounter (HOSPITAL_COMMUNITY): Payer: Self-pay

## 2024-02-23 ENCOUNTER — Emergency Department (HOSPITAL_COMMUNITY)
Admission: EM | Admit: 2024-02-23 | Discharge: 2024-02-24 | Disposition: A | Discharge status: Home / Self Care | Attending: Emergency Medicine | Admitting: Emergency Medicine

## 2024-02-23 ENCOUNTER — Other Ambulatory Visit: Payer: Self-pay

## 2024-02-23 DIAGNOSIS — R55 Syncope and collapse: Secondary | ICD-10-CM | POA: Diagnosis present

## 2024-02-23 DIAGNOSIS — Z7901 Long term (current) use of anticoagulants: Secondary | ICD-10-CM | POA: Insufficient documentation

## 2024-02-23 LAB — CBC WITH DIFFERENTIAL/PLATELET
Abs Immature Granulocytes: 0.03 10*3/uL (ref 0.00–0.07)
Basophils Absolute: 0 10*3/uL (ref 0.0–0.1)
Basophils Relative: 0 %
Eosinophils Absolute: 0 10*3/uL (ref 0.0–0.5)
Eosinophils Relative: 0 %
HCT: 44.8 % (ref 36.0–46.0)
Hemoglobin: 15.6 g/dL — ABNORMAL HIGH (ref 12.0–15.0)
Immature Granulocytes: 0 %
Lymphocytes Relative: 24 %
Lymphs Abs: 1.8 10*3/uL (ref 0.7–4.0)
MCH: 33.5 pg (ref 26.0–34.0)
MCHC: 34.8 g/dL (ref 30.0–36.0)
MCV: 96.1 fL (ref 80.0–100.0)
Monocytes Absolute: 0.6 10*3/uL (ref 0.1–1.0)
Monocytes Relative: 8 %
Neutro Abs: 4.8 10*3/uL (ref 1.7–7.7)
Neutrophils Relative %: 68 %
Platelets: 161 10*3/uL (ref 150–400)
RBC: 4.66 MIL/uL (ref 3.87–5.11)
RDW: 13.4 % (ref 11.5–15.5)
WBC: 7.3 10*3/uL (ref 4.0–10.5)
nRBC: 0 % (ref 0.0–0.2)

## 2024-02-23 LAB — COMPREHENSIVE METABOLIC PANEL WITH GFR
ALT: 22 U/L (ref 0–44)
AST: 27 U/L (ref 15–41)
Albumin: 4.1 g/dL (ref 3.5–5.0)
Alkaline Phosphatase: 52 U/L (ref 38–126)
Anion gap: 11 (ref 5–15)
BUN: 6 mg/dL (ref 6–20)
CO2: 21 mmol/L — ABNORMAL LOW (ref 22–32)
Calcium: 9.1 mg/dL (ref 8.9–10.3)
Chloride: 105 mmol/L (ref 98–111)
Creatinine, Ser: 0.76 mg/dL (ref 0.44–1.00)
GFR, Estimated: >60 mL/min (ref >60)
Glucose, Bld: 117 mg/dL — ABNORMAL HIGH (ref 70–99)
Potassium: 3.6 mmol/L (ref 3.5–5.1)
Sodium: 137 mmol/L (ref 135–145)
Total Bilirubin: 1.7 mg/dL — ABNORMAL HIGH (ref 0.0–1.2)
Total Protein: 7.6 g/dL (ref 6.5–8.1)

## 2024-02-23 MED ORDER — LORAZEPAM 2 MG/ML IJ SOLN
1.0000 mg | Freq: Once | INTRAMUSCULAR | Status: AC
  Administered 2024-02-23: 1 mg via INTRAVENOUS
  Filled 2024-02-23: qty 1

## 2024-02-23 NOTE — ED Provider Notes (Signed)
 [facility] EM[facility] AT [facility] Provider Note   CSN: [id number] Arrival date & time: 02/23/24  2030     History  Chief Complaint  Patient presents with   Neck Pain    Sherry Ortega is a 38 y.o. female.   Neck Pain Associated symptoms: no chest pain and no fever         Sherry Ortega is a 38 y.o. 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  She endorses multiple syncopal events since Sunday.  She has been evaluated on 6 9 and on 610 at [facility] emergency department for similar symptoms.  She states that she was treated for panic attacks and sent home.  She woke a around 11 AM with severe pain of her neck.  She states pain is worse with movement.  She also endorses some nausea and intermittent vomiting.  Patient's friend who is also at bedside states that she has been under significant stress recently with the death of her child.  She also states that she has been smoking marijuana and believes she may have gotten marijuana that is laced with something.  Patient states that she has been taking her antianxiety medications but states it has not been helping recently.  She denies any abdominal pain chest pain or shortness of breath.  Has not taken her Coumadin dose today    Home Medications Prior to Admission medications   Medication Sig Start Date End Date Taking? Authorizing Provider  acetaminophen  (TYLENOL ) 500 MG tablet Take by mouth.    [provider]  ALPRAZolam (XANAX) 0.25 MG tablet Take by mouth. 12/03/23 12/02/24  [provider]  amoxicillin-clavulanate (AUGMENTIN) 875-125 MG tablet 1 tablet Orally every 12 hrs for 10 day(s) 04/08/22   [provider]  apixaban (ELIQUIS) 2.5 MG TABS tablet Eliquis    [provider]  busPIRone (BUSPAR) 5 MG tablet 1 tablet Orally three times a day 01/12/23   [provider]  cephALEXin  (KEFLEX ) 500 MG capsule Take 1 capsule (500 mg total) by mouth 4 (four) times daily. 02/07/19   Zammit, Joseph, MD  Cyanocobalamin (VITAMIN B 12) 100 MCG LOZG Vitamin B 12    [provider]  Cyanocobalamin (VITAMIN B12) 1000 MCG TBCR 1 tablet Orally Once a day 11/06/22   [provider]  FLUOXETINE & DIET MANAGE PROD PO Fluoxetine    [provider]  FLUoxetine (PROZAC) 40 MG capsule Take 1 capsule by mouth daily. 08/20/23 08/19/24  [provider]  guaiFENesin-codeine 100-10 MG/5ML syrup 10 mL as needed Orally every 4 hrs for 5 days 04/08/22   [provider]  nicotine (NICODERM CQ - DOSED IN MG/24 HOURS) 14 mg/24hr patch Place onto the skin. 03/10/23   [provider]  nortriptyline (PAMELOR) 10 MG capsule Take 1 capsule at night for 1 week, then increase to 2 capsules at night and continue this dose 06/05/15   [provider]  omeprazole (PRILOSEC) 40 MG capsule Take by mouth.    [provider]  ondansetron  (ZOFRAN  ODT) 4 MG disintegrating tablet 4mg  ODT q4 hours prn nausea/vomit 02/07/19   Zammit, Joseph, MD  oxyCODONE -acetaminophen  (PERCOCET) 5-325 MG per tablet Take 1 tablet by mouth every 4 (four) hours as needed for pain. 01/27/13   Carlton Chick, MD  pantoprazole  (PROTONIX ) 20 MG  tablet Take 1 tablet (20 mg total) by mouth daily. 02/07/19   Cheyenne Cotta, MD  predniSONE (STERAPRED UNI-PAK 21 TAB) 10 MG (21) TBPK tablet as directed Orally Once a day for 6 days 04/08/22   [provider]  warfarin (COUMADIN) 4 MG tablet 1 tablet Orally on tuesday, thursday, saturday and sunday    [provider]  warfarin (COUMADIN) 5 MG tablet Current dose: 5 mg daily 07/23/23 07/21/24  [provider]      Allergies    Enoxaparin, Iodinated contrast media, Metoclopramide , and Fondaparinux     Review of Systems   Review of Systems  Constitutional:  Negative for appetite change, chills and fever.  Eyes:  Negative for visual disturbance.  Respiratory:  Negative for cough and shortness of breath.   Cardiovascular:  Negative for chest pain.  Gastrointestinal:  Positive for nausea. Negative for abdominal pain, diarrhea and vomiting.  Genitourinary:  Negative for difficulty urinating.  Musculoskeletal:  Positive for neck pain.  Neurological:  Positive for tremors.  Psychiatric/Behavioral:  Negative for confusion. The patient is nervous/anxious.     Physical Exam Updated Vital Signs BP 123/88   Pulse 86   Temp 98 F (36.7 C) (Oral)   Resp 18   Ht 5' 6 (1.676 m)   Wt 56.7 kg   SpO2 96%   BMI 20.18 kg/m  Physical Exam Vitals and nursing note reviewed.  Constitutional:      Appearance: Normal appearance.     Comments: Patient is tearful, very tremulous during exam  HENT:     Head: Atraumatic.     Mouth/Throat:     Mouth: Mucous membranes are moist.  Eyes:     Conjunctiva/sclera: Conjunctivae normal.     Pupils: Pupils are equal, round, and reactive to light.  Neck:     Comments: Tender to palpation bilateral cervical paraspinal muscles, tenderness on right greater than left.  No midline tenderness of bony step-offs. Cardiovascular:     Rate and Rhythm: Normal rate and regular rhythm.     Pulses: Normal pulses.  Pulmonary:     Effort: Pulmonary effort is normal.     Breath sounds: Normal breath sounds.  Abdominal:     Palpations: Abdomen is soft.     Tenderness: There is no abdominal tenderness.  Musculoskeletal:        General: Normal range of motion.     Cervical back: Tenderness present. No edema or rigidity. Pain with movement and muscular tenderness present. No spinous process tenderness. Normal range of motion.     Right lower leg: No edema.     Left lower leg: No edema.  Skin:    General: Skin is warm.     Capillary Refill: Capillary refill takes  less than 2 seconds.  Neurological:     General: No focal deficit present.     Mental Status: She is alert.     Sensory: No sensory deficit.     Motor: No weakness.  Psychiatric:        Mood and Affect: Mood is anxious. Affect is tearful.        Speech: Speech normal.        Behavior: Behavior is agitated. Behavior is cooperative.        Thought Content: Thought content is not paranoid. Thought content does not include homicidal or suicidal ideation.     ED Results / Procedures / Treatments   Labs (all labs ordered are listed, but only abnormal results are  displayed) Labs Reviewed  CBC WITH DIFFERENTIAL/PLATELET - Abnormal; Notable for the following components:      Result Value   Hemoglobin 15.6 (*)    All other components within normal limits  COMPREHENSIVE METABOLIC PANEL WITH GFR - Abnormal; Notable for the following components:   CO2 21 (*)    Glucose, Bld 117 (*)    Total Bilirubin 1.7 (*)    All other components within normal limits  RAPID URINE DRUG SCREEN, HOSP PERFORMED  URINALYSIS, ROUTINE W REFLEX MICROSCOPIC    EKG EKG Interpretation Date/Time:  Wednesday February 23 2024 23:11:32 EDT Ventricular Rate:  84 PR Interval:  151 QRS Duration:  88 QT Interval:  386 QTC Calculation: 457 R Axis:   66  Text Interpretation: Sinus rhythm Anteroseptal infarct, age indeterminate T wave inversion Abnormal ECG Confirmed by Eldon Greenland ([id number]) on 02/23/2024 11:35:05 PM  Radiology No results found.  Procedures Procedures    Medications Ordered in ED Medications - No data to display  ED Course/ Medical Decision Making/ A&P                                 Medical Decision Making Patient here from home brought in by EMS for evaluation of neck pain anxiety and syncopal episodes.  She has been having multiple syncopal episodes since Sunday.  Evaluated at another ER facility on 6 /9 and again yesterday on 6/10 was discharged from the emergency department last evening,  was treated for her anxiety woke today at 11 AM with worsening neck pain and continued syncopal episodes.  Friend at bedside states that patient has been under increased stress recently to death of her son, has been smoking marijuana concerned that the marijuana has been laced with something and states that patient was asking for pain medication at previous ER facility.  On review of medical records, patient did have CT head on 6/9 without acute intracranial injury.  Patient is very anxious appearing on my exam she is up and down off the stretcher and is now requesting pain medication.  She states that she missed her warfarin dose this evening but has otherwise been compliant.  My clinical suspicion for PE is low given that she has reassuring vital signs without hypoxia tachycardia or tachypnea.  Electrolyte abnormality, ACS, also considered although felt less likely.  Low clinical suspicion for dissection, possible torticollis  Patient is calling out asking for pain medication.  Amount and/or Complexity of Data Reviewed Labs: ordered.    Details: No evidence of leukocytosis, chemistries without derangement, urinalysis and UDS are pending ECG/medicine tests: ordered.    Details: EKG shows sinus rhythm age-indeterminate T wave inversion Discussion of management or test interpretation with external provider(s): [id number] was advised by nursing staff that patient had a witnessed, brief syncopal episode in the room.  It was reported to me that patient was moving lower extremities during episode, her pupils were reactive and she was responding to pain she spontaneously awoke and was alert x 4  It was documented on patient's previous ER visit that she had a witnessed syncopal episode as well in which the patient described impending episode and intentionally moved towards a chair and sat down prior to her syncopal episode  The cause of her multiple syncopal episodes is unclear at this time patient would  benefit from hospital observation, she is agreeable to this plan.  On recheck, she is currently resting appears more  calm  Discussed with overnight provider, Dr. Wallis Gun who agrees to arrange reevaluate and arrange disposition  Risk Prescription drug management.           Final Clinical Impression(s) / ED Diagnoses Final diagnoses:  Syncope, unspecified syncope type    Rx / DC Orders ED Discharge Orders     None         Catherne Clubs, PA-C 02/24/24 0009    Eldon Greenland, MD 02/24/24 [phone]

## 2024-02-23 NOTE — ED Triage Notes (Signed)
 Pt bib EMS with c/o neck pain that started after waking up this afternoon, pt had syncopal episode with EMS in route, vitals WNL. Pt says she had panic attack last night and was seen in [facility] ED given haldol and other sedatives, went home slept woke up around 11 am with neck pain.

## 2024-02-23 NOTE — ED Notes (Signed)
 Called into patient's room that she passed out. Patient moving lower extremities, pupils reactive responds to pain. VSS. Patient spontaneously woke up and is A/Ox4. No recollection of passing out events.

## 2024-02-24 LAB — URINALYSIS, ROUTINE W REFLEX MICROSCOPIC
Bilirubin Urine: NEGATIVE
Glucose, UA: NEGATIVE mg/dL
Ketones, ur: 5 mg/dL — AB
Nitrite: NEGATIVE
Protein, ur: NEGATIVE mg/dL
Specific Gravity, Urine: 1.01 (ref 1.005–1.030)
pH: 6 (ref 5.0–8.0)

## 2024-02-24 LAB — RAPID URINE DRUG SCREEN, HOSP PERFORMED
Amphetamines: NOT DETECTED
Barbiturates: NOT DETECTED
Benzodiazepines: POSITIVE — AB
Cocaine: NOT DETECTED
Opiates: NOT DETECTED
Tetrahydrocannabinol: POSITIVE — AB

## 2024-02-24 LAB — TROPONIN I (HIGH SENSITIVITY)
Troponin I (High Sensitivity): <2 ng/L (ref <18)
Troponin I (High Sensitivity): <2 ng/L (ref <18)

## 2024-02-24 MED ORDER — LORAZEPAM 1 MG PO TABS
1.0000 mg | ORAL_TABLET | Freq: Once | ORAL | Status: AC
  Administered 2024-02-24: 1 mg via ORAL
  Filled 2024-02-24: qty 1

## 2024-02-24 MED ORDER — ONDANSETRON HCL 4 MG/2ML IJ SOLN
4.0000 mg | Freq: Once | INTRAMUSCULAR | Status: AC
  Administered 2024-02-24: 4 mg via INTRAVENOUS
  Filled 2024-02-24: qty 2

## 2024-02-24 NOTE — ED Notes (Signed)
Patient verbalizes understanding of discharge instructions. Opportunity for questioning and answers were provided. Armband removed by staff, pt discharged from ED. Wheeled out to lobby, awaiting ride

## 2024-02-24 NOTE — ED Provider Notes (Signed)
 Patient feeling improved though, having some anxiety.  She declines admission Denies any active chest pain or shortness of breath. She will call her PCP today   Eldon Greenland, MD 02/24/24 [phone]
